# Patient Record
Sex: Female | Born: 1945 | Race: White | Hispanic: No | Marital: Married | Smoking: Never smoker
Health system: Southern US, Community
[De-identification: ages and names within clinical notes are randomized; demographics above are authoritative.]

## PROBLEM LIST (undated history)

## (undated) DIAGNOSIS — E7800 Pure hypercholesterolemia, unspecified: Secondary | ICD-10-CM

## (undated) HISTORY — PX: TUBAL LIGATION: SHX77

## (undated) HISTORY — PX: APPENDECTOMY: SHX54

---

## 2017-02-25 ENCOUNTER — Other Ambulatory Visit: Payer: Self-pay | Admitting: Family Medicine

## 2017-02-25 DIAGNOSIS — N6489 Other specified disorders of breast: Secondary | ICD-10-CM

## 2017-02-28 ENCOUNTER — Ambulatory Visit
Admission: RE | Admit: 2017-02-28 | Discharge: 2017-02-28 | Disposition: A | Discharge status: Home / Self Care | Payer: BLUE CROSS/BLUE SHIELD | Source: Ambulatory Visit | Attending: Family Medicine | Admitting: Family Medicine

## 2017-02-28 ENCOUNTER — Other Ambulatory Visit: Payer: Self-pay | Admitting: Family Medicine

## 2017-02-28 DIAGNOSIS — N6489 Other specified disorders of breast: Secondary | ICD-10-CM

## 2017-03-31 ENCOUNTER — Ambulatory Visit: Payer: Self-pay | Admitting: Surgery

## 2017-03-31 DIAGNOSIS — N6320 Unspecified lump in the left breast, unspecified quadrant: Secondary | ICD-10-CM

## 2017-03-31 DIAGNOSIS — N6310 Unspecified lump in the right breast, unspecified quadrant: Secondary | ICD-10-CM

## 2017-03-31 NOTE — H&P (Signed)
History of Present Illness (Cambrie Sonnenfeld K. Norvell Ureste MD; 03/31/2017 10:03 PM) The patient is a 70 year old female who presents with a breast mass. Referred by Dr. Dan Boyle for left breast CSL and discordant biopsy right breast PCP - Dr. Michael Kalish  This is a 70 yo female in good health who presents after recent routine screening mammogram performed at [facility] in [place]. The original images including ultrasound or any radiology reports are not available to me. On the right, she had a focal area of breast distortion. On the left, she had a focal area of distortion, as well. She underwent bilateral stereotactic biopsies. The right side showed benign findings that were felt to be discordant. The left side showed a complex sclerosing lesion. She is referred for bilateral lumpectomies.  Menarche - 15 First pregnancy - 27 Breastfeed - yes Hormones - no FH - no  CLINICAL DATA: Patient presents for stereotactic core needle biopsy of focal distortion over the upper central right breast.  EXAM: Right BREAST STEREOTACTIC CORE NEEDLE BIOPSY  COMPARISON: Previous exams.  FINDINGS: The patient and I discussed the procedure of stereotactic-guided biopsy including benefits and alternatives. We discussed the high likelihood of a successful procedure. We discussed the risks of the procedure including infection, bleeding, tissue injury, clip migration, and inadequate sampling. Informed written consent was given. The usual time out protocol was performed immediately prior to the procedure.  Using sterile technique and 1% Lidocaine as local anesthetic, under stereotactic guidance, a 9 gauge vacuum assisted device was used to perform core needle biopsy of focal distortion over the upper central right breast using a superior to inferior approach.  Lesion quadrant: Right upper outer quadrant.  At the conclusion of the procedure, a coil shaped tissue marker clip was deployed into the biopsy  cavity. Follow-up 2-view mammogram was performed and dictated separately.  IMPRESSION: Stereotactic-guided biopsy of focal right breast distortion. No apparent complications.  Electronically Signed: By: Daniel Boyle M.D. On: 02/28/2017 09:51  CLINICAL DATA: Patient presents for stereotactic core needle biopsy of focal distortion over the medial central left breast.  EXAM: Left BREAST STEREOTACTIC CORE NEEDLE BIOPSY  COMPARISON: Previous exams.  FINDINGS: The patient and I discussed the procedure of stereotactic-guided biopsy including benefits and alternatives. We discussed the high likelihood of a successful procedure. We discussed the risks of the procedure including infection, bleeding, tissue injury, clip migration, and inadequate sampling. Informed written consent was given. The usual time out protocol was performed immediately prior to the procedure.  Using sterile technique and 1% Lidocaine as local anesthetic, under stereotactic guidance, a 9 gauge vacuum assisted device was used to perform core needle biopsy of the targeted focal distortion over the medial central breast using a superior to inferior approach.  Lesion quadrant: Left upper inner quadrant.  At the conclusion of the procedure, a coil shaped tissue marker clip was deployed into the biopsy cavity. Follow-up 2-view mammogram was performed and dictated separately.  IMPRESSION: Stereotactic-guided biopsy of focal distortion left breast. No apparent complications.  Electronically Signed: By: Daniel Boyle M.D. On: 02/28/2017 09:49  CLINICAL DATA: Patient is post stereotactic core needle biopsy of focal distortion over the upper central right breast and medial central left breast.  EXAM: DIAGNOSTIC bilateral MAMMOGRAM POST stereotactic BIOPSY  COMPARISON: Previous exam(s).  FINDINGS: Mammographic images were obtained following stereotactic guided biopsy of focal distortion over the upper  central right breast and medial central left breast. Images demonstrates satisfactory placement of a coil shaped metallic clip over   the upper central right breast and medial mid to upper left breast in the expected region of the targeted distortion. There is a 2 cm hematoma over the right breast biopsy site.  There is satisfactory placement of a coil shaped metallic clip over the medial mid to upper left breast. Note that the left-sided clip may be approximate 1 cm inferior to the distortion on the MLO image.  IMPRESSION: Satisfactory placement of a coil shaped metallic clips bilaterally after bilateral stereotactic core needle biopsy of bilateral focal distortion.  Final Assessment: Post Procedure Mammograms for Marker Placement   Electronically Signed By: Elberta Fortisaniel Boyle M.D. On: 02/28/2017 10:29   Diagnosis 1. Breast, right, needle core biopsy, upper central - FIBROCYSTIC CHANGES. - USUAL DUCTAL HYPERPLASIA. - THERE IS NO EVIDENCE OF MALIGNANCY. - SEE COMMENT. 2. Breast, left, needle core biopsy, medial central - COMPLEX SCLEROSING LESION. - USUAL DUCTAL HYPERPLASIA. - SEE COMMENT. Microscopic Comment 1. The results were [facility] to [facility] on 03/03/17. (JBK:gt, 03/03/17) Pecola LeisureJOSHUA KISH MD Pathologist, Electronic Signature (Case signed 03/03/2017)   Past Surgical History ([name], RMA; 03/31/2017 9:22 AM) Appendectomy Breast Biopsy Bilateral. Colon Polyp Removal - Colonoscopy  Diagnostic Studies History ([name], RMA; 03/31/2017 9:22 AM) Colonoscopy 1-5 years ago Mammogram within last year Pap Smear 1-5 years ago  Allergies ([name], RMA; 03/31/2017 9:24 AM) No Known Drug Allergies 03/31/2017 Allergies Reconciled  Medication History ([name], RMA; 03/31/2017 9:25 AM) Fluticasone Propionate (50MCG/ACT Suspension, Nasal) Active. Simvastatin (20MG  Tablet, Oral) Active. Vitamin D (2000UNIT Capsule,  Oral) Active. Medications Reconciled  Social History ([name], RMA; 03/31/2017 9:22 AM) Caffeine use Carbonated beverages, Coffee, Tea. No alcohol use Tobacco use Never smoker.  Family History ([name], RMA; 03/31/2017 9:22 AM) Alcohol Abuse Mother. Diabetes Mellitus Mother. Hypertension Mother.  Pregnancy / Birth History ([name], RMA; 03/31/2017 9:22 AM) Age at menarche 15 years. Age of menopause 3851-55 Gravida 3 Irregular periods Length (months) of breastfeeding 3-6 Maternal age 921-25 Para 3  Other Problems ([name], RMA; 03/31/2017 9:22 AM) No pertinent past medical history     Review of Systems (Tanisha A. Brown RMA; 03/31/2017 9:22 AM) General Not Present- Appetite Loss, Chills, Fatigue, Fever, Night Sweats, Weight Gain and Weight Loss. Skin Not Present- Change in Wart/Mole, Dryness, Hives, Jaundice, New Lesions, Non-Healing Wounds, Rash and Ulcer. HEENT Present- Seasonal Allergies and Wears glasses/contact lenses. Not Present- Earache, Hearing Loss, Hoarseness, Nose Bleed, Oral Ulcers, Ringing in the Ears, Sinus Pain, Sore Throat, Visual Disturbances and Yellow Eyes. Respiratory Present- Snoring. Not Present- Bloody sputum, Chronic Cough, Difficulty Breathing and Wheezing. Breast Not Present- Breast Mass, Breast Pain, Nipple Discharge and Skin Changes. Cardiovascular Not Present- Chest Pain, Difficulty Breathing Lying Down, Leg Cramps, Palpitations, Rapid Heart Rate, Shortness of Breath and Swelling of Extremities. Gastrointestinal Not Present- Abdominal Pain, Bloating, Bloody Stool, Change in Bowel Habits, Chronic diarrhea, Constipation, Difficulty Swallowing, Excessive gas, Gets full quickly at meals, Hemorrhoids, Indigestion, Nausea, Rectal Pain and Vomiting. Female Genitourinary Present- Frequency. Not Present- Nocturia, Painful Urination, Pelvic Pain and Urgency. Musculoskeletal Present- Joint Stiffness. Not Present- Back  Pain, Joint Pain, Muscle Pain, Muscle Weakness and Swelling of Extremities. Neurological Not Present- Decreased Memory, Fainting, Headaches, Numbness, Seizures, Tingling, Tremor, Trouble walking and Weakness. Psychiatric Not Present- Anxiety, Bipolar, Change in Sleep Pattern, Depression, Fearful and Frequent crying. Endocrine Not Present- Cold Intolerance, Excessive Hunger, Hair Changes, Heat Intolerance, Hot flashes and New Diabetes. Hematology Not Present- Blood  Thinners, Easy Bruising, Excessive bleeding, Gland problems, HIV and Persistent Infections.  Vitals (Tanisha A. Brown RMA; 03/31/2017 9:24 AM) 03/31/2017 9:23 AM Weight: 159.6 lb Height: 64in Body Surface Area: 1.78 m Body Mass Index: 27.39 kg/m  Temp.: 96.30F  Pulse: 104 (Regular)  P.OX: 93% (Room air) BP: 120/84 (Sitting, Left Arm, Standard)      Physical Exam Molli Hazard K. Ruble Buttler MD; 03/31/2017 10:03 PM)  The physical exam findings are as follows: Note:WDWN in NAD Eyes: Pupils equal, round; sclera anicteric HENT: Oral mucosa moist; good dentition Neck: No masses palpated, no thyromegaly Lungs: CTA bilaterally; normal respiratory effort Breasts: symmetric; healing biopsy sites; no nipple retraction or discharge No dominant masses - bilateral fibrocystic changes; no axillary lymphadenopathy CV: Regular rate and rhythm; no murmurs; extremities well-perfused with no edema Abd: +bowel sounds, soft, non-tender, no palpable organomegaly; no palpable hernias Skin: Warm, dry; no sign of jaundice Psychiatric - alert and oriented x 4; calm mood and affect    Assessment & Plan Molli Hazard K. Willamina Grieshop MD; 03/31/2017 10:06 AM)  MASS OF RIGHT BREAST ON MAMMOGRAM (N63.10)  MASS OF LEFT BREAST ON MAMMOGRAM (N63.20)  Current Plans Schedule for Surgery - Bilateral radioactive seed localized lumpectomies. The surgical procedure has been discussed with the patient. Potential risks, benefits, alternative treatments, and expected  outcomes have been explained. All of the patient's questions at this time have been answered. The likelihood of reaching the patient's treatment goal is good. The patient understand the proposed surgical procedure and wishes to proceed.  Wilmon Arms. Corliss Skains, MD, [facility] Surgery  General/ Trauma Surgery  03/31/2017 10:04 PM

## 2017-04-09 ENCOUNTER — Other Ambulatory Visit: Payer: Self-pay | Admitting: Surgery

## 2017-04-09 DIAGNOSIS — N6320 Unspecified lump in the left breast, unspecified quadrant: Secondary | ICD-10-CM

## 2017-04-10 ENCOUNTER — Encounter (HOSPITAL_BASED_OUTPATIENT_CLINIC_OR_DEPARTMENT_OTHER): Payer: Self-pay | Admitting: *Deleted

## 2017-04-16 ENCOUNTER — Ambulatory Visit
Admission: RE | Admit: 2017-04-16 | Discharge: 2017-04-16 | Disposition: A | Discharge status: Home / Self Care | Payer: Medicare Other | Source: Ambulatory Visit | Attending: Surgery | Admitting: Surgery

## 2017-04-16 DIAGNOSIS — N6320 Unspecified lump in the left breast, unspecified quadrant: Secondary | ICD-10-CM

## 2017-04-16 DIAGNOSIS — N6310 Unspecified lump in the right breast, unspecified quadrant: Secondary | ICD-10-CM

## 2017-04-16 NOTE — Progress Notes (Signed)
Ensure pre surgery drink given with instructions to complete by 0530 dos, pt verbalized understanding. 

## 2017-04-17 ENCOUNTER — Ambulatory Visit (HOSPITAL_BASED_OUTPATIENT_CLINIC_OR_DEPARTMENT_OTHER): Payer: BLUE CROSS/BLUE SHIELD | Admitting: Anesthesiology

## 2017-04-17 ENCOUNTER — Encounter (HOSPITAL_BASED_OUTPATIENT_CLINIC_OR_DEPARTMENT_OTHER)
Admission: RE | Disposition: A | Discharge status: Home / Self Care | Payer: Self-pay | Source: Ambulatory Visit | Attending: Surgery

## 2017-04-17 ENCOUNTER — Encounter (HOSPITAL_BASED_OUTPATIENT_CLINIC_OR_DEPARTMENT_OTHER): Payer: Self-pay | Admitting: Anesthesiology

## 2017-04-17 ENCOUNTER — Ambulatory Visit (HOSPITAL_BASED_OUTPATIENT_CLINIC_OR_DEPARTMENT_OTHER)
Admission: RE | Admit: 2017-04-17 | Discharge: 2017-04-17 | Disposition: A | Discharge status: Home / Self Care | Payer: BLUE CROSS/BLUE SHIELD | Source: Ambulatory Visit | Attending: Surgery | Admitting: Surgery

## 2017-04-17 ENCOUNTER — Ambulatory Visit
Admission: RE | Admit: 2017-04-17 | Discharge: 2017-04-17 | Disposition: A | Discharge status: Home / Self Care | Payer: Medicare Other | Source: Ambulatory Visit | Attending: Surgery | Admitting: Surgery

## 2017-04-17 DIAGNOSIS — N6022 Fibroadenosis of left breast: Secondary | ICD-10-CM | POA: Diagnosis not present

## 2017-04-17 DIAGNOSIS — N6092 Unspecified benign mammary dysplasia of left breast: Secondary | ICD-10-CM | POA: Insufficient documentation

## 2017-04-17 DIAGNOSIS — N6091 Unspecified benign mammary dysplasia of right breast: Secondary | ICD-10-CM | POA: Diagnosis not present

## 2017-04-17 DIAGNOSIS — N6320 Unspecified lump in the left breast, unspecified quadrant: Secondary | ICD-10-CM

## 2017-04-17 DIAGNOSIS — N6011 Diffuse cystic mastopathy of right breast: Secondary | ICD-10-CM | POA: Insufficient documentation

## 2017-04-17 DIAGNOSIS — N6310 Unspecified lump in the right breast, unspecified quadrant: Secondary | ICD-10-CM | POA: Diagnosis present

## 2017-04-17 HISTORY — DX: Pure hypercholesterolemia, unspecified: E78.00

## 2017-04-17 HISTORY — PX: BREAST LUMPECTOMY WITH RADIOACTIVE SEED LOCALIZATION: SHX6424

## 2017-04-17 SURGERY — BREAST LUMPECTOMY WITH RADIOACTIVE SEED LOCALIZATION
Anesthesia: General | Site: Breast | Laterality: Bilateral

## 2017-04-17 MED ORDER — LIDOCAINE 2% (20 MG/ML) 5 ML SYRINGE
INTRAMUSCULAR | Status: AC
  Filled 2017-04-17: qty 5

## 2017-04-17 MED ORDER — GABAPENTIN 300 MG PO CAPS
ORAL_CAPSULE | ORAL | Status: AC
  Filled 2017-04-17: qty 1

## 2017-04-17 MED ORDER — FENTANYL CITRATE (PF) 100 MCG/2ML IJ SOLN: 25.0000 ug | INTRAMUSCULAR | Status: DC | PRN

## 2017-04-17 MED ORDER — PROPOFOL 10 MG/ML IV BOLUS
INTRAVENOUS | Status: DC | PRN
  Administered 2017-04-17: 100 mg via INTRAVENOUS

## 2017-04-17 MED ORDER — CEFAZOLIN SODIUM-DEXTROSE 2-4 GM/100ML-% IV SOLN
INTRAVENOUS | Status: AC
  Filled 2017-04-17: qty 100

## 2017-04-17 MED ORDER — ACETAMINOPHEN 160 MG/5ML PO SOLN: 325.0000 mg | ORAL | Status: DC | PRN

## 2017-04-17 MED ORDER — CEFAZOLIN SODIUM-DEXTROSE 2-4 GM/100ML-% IV SOLN
2.0000 g | INTRAVENOUS | Status: AC
  Administered 2017-04-17: 2 g via INTRAVENOUS

## 2017-04-17 MED ORDER — CHLORHEXIDINE GLUCONATE CLOTH 2 % EX PADS: 6.0000 | MEDICATED_PAD | Freq: Once | CUTANEOUS | Status: DC

## 2017-04-17 MED ORDER — LACTATED RINGERS IV SOLN
INTRAVENOUS | Status: DC
  Administered 2017-04-17 (×3): via INTRAVENOUS

## 2017-04-17 MED ORDER — MIDAZOLAM HCL 2 MG/2ML IJ SOLN
INTRAMUSCULAR | Status: AC
  Filled 2017-04-17: qty 2

## 2017-04-17 MED ORDER — ONDANSETRON HCL 4 MG/2ML IJ SOLN
INTRAMUSCULAR | Status: AC
  Filled 2017-04-17: qty 2

## 2017-04-17 MED ORDER — FENTANYL CITRATE (PF) 100 MCG/2ML IJ SOLN
INTRAMUSCULAR | Status: AC
  Filled 2017-04-17: qty 2

## 2017-04-17 MED ORDER — PROPOFOL 10 MG/ML IV BOLUS
INTRAVENOUS | Status: AC
  Filled 2017-04-17: qty 20

## 2017-04-17 MED ORDER — SCOPOLAMINE 1 MG/3DAYS TD PT72: 1.0000 | MEDICATED_PATCH | Freq: Once | TRANSDERMAL | Status: DC | PRN

## 2017-04-17 MED ORDER — BUPIVACAINE-EPINEPHRINE (PF) 0.5% -1:200000 IJ SOLN
INTRAMUSCULAR | Status: AC
  Filled 2017-04-17: qty 30

## 2017-04-17 MED ORDER — SCOPOLAMINE 1 MG/3DAYS TD PT72
MEDICATED_PATCH | TRANSDERMAL | Status: AC
  Filled 2017-04-17: qty 1

## 2017-04-17 MED ORDER — BUPIVACAINE-EPINEPHRINE 0.5% -1:200000 IJ SOLN
INTRAMUSCULAR | Status: DC | PRN
  Administered 2017-04-17: 20 mL

## 2017-04-17 MED ORDER — GLYCOPYRROLATE 0.2 MG/ML IJ SOLN
INTRAMUSCULAR | Status: DC | PRN
  Administered 2017-04-17: 0.1 mg via INTRAVENOUS

## 2017-04-17 MED ORDER — MIDAZOLAM HCL 5 MG/5ML IJ SOLN
INTRAMUSCULAR | Status: DC | PRN
  Administered 2017-04-17: 2 mg via INTRAVENOUS

## 2017-04-17 MED ORDER — EPHEDRINE 5 MG/ML INJ
INTRAVENOUS | Status: AC
  Filled 2017-04-17: qty 10

## 2017-04-17 MED ORDER — EPHEDRINE SULFATE 50 MG/ML IJ SOLN
INTRAMUSCULAR | Status: DC | PRN
  Administered 2017-04-17: 10 mg via INTRAVENOUS

## 2017-04-17 MED ORDER — DEXAMETHASONE SODIUM PHOSPHATE 10 MG/ML IJ SOLN
INTRAMUSCULAR | Status: AC
  Filled 2017-04-17: qty 1

## 2017-04-17 MED ORDER — ACETAMINOPHEN 325 MG PO TABS: 325.0000 mg | ORAL_TABLET | ORAL | Status: DC | PRN

## 2017-04-17 MED ORDER — LIDOCAINE HCL (CARDIAC) 20 MG/ML IV SOLN
INTRAVENOUS | Status: DC | PRN
  Administered 2017-04-17: 30 mg via INTRAVENOUS

## 2017-04-17 MED ORDER — ACETAMINOPHEN 500 MG PO TABS
ORAL_TABLET | ORAL | Status: AC
Start: 2017-04-17 — End: 2017-04-17
  Filled 2017-04-17: qty 2

## 2017-04-17 MED ORDER — OXYCODONE HCL 5 MG/5ML PO SOLN: 5.0000 mg | Freq: Once | ORAL | Status: DC | PRN

## 2017-04-17 MED ORDER — FENTANYL CITRATE (PF) 100 MCG/2ML IJ SOLN: 50.0000 ug | INTRAMUSCULAR | Status: DC | PRN

## 2017-04-17 MED ORDER — HYDROCODONE-ACETAMINOPHEN 5-325 MG PO TABS: 1.0000 | ORAL_TABLET | Freq: Four times a day (QID) | ORAL | 0 refills | Status: AC | PRN

## 2017-04-17 MED ORDER — FENTANYL CITRATE (PF) 100 MCG/2ML IJ SOLN
INTRAMUSCULAR | Status: DC | PRN
  Administered 2017-04-17 (×2): 25 ug via INTRAVENOUS
  Administered 2017-04-17: 100 ug via INTRAVENOUS
  Administered 2017-04-17: 25 ug via INTRAVENOUS

## 2017-04-17 MED ORDER — DEXAMETHASONE SODIUM PHOSPHATE 4 MG/ML IJ SOLN
INTRAMUSCULAR | Status: DC | PRN
  Administered 2017-04-17: 10 mg via INTRAVENOUS

## 2017-04-17 MED ORDER — ONDANSETRON HCL 4 MG/2ML IJ SOLN
INTRAMUSCULAR | Status: DC | PRN
  Administered 2017-04-17: 4 mg via INTRAVENOUS

## 2017-04-17 MED ORDER — ACETAMINOPHEN 500 MG PO TABS
1000.0000 mg | ORAL_TABLET | ORAL | Status: AC
  Administered 2017-04-17: 1000 mg via ORAL

## 2017-04-17 MED ORDER — OXYCODONE HCL 5 MG PO TABS: 5.0000 mg | ORAL_TABLET | Freq: Once | ORAL | Status: DC | PRN

## 2017-04-17 MED ORDER — PHENYLEPHRINE 40 MCG/ML (10ML) SYRINGE FOR IV PUSH (FOR BLOOD PRESSURE SUPPORT)
PREFILLED_SYRINGE | INTRAVENOUS | Status: AC
  Filled 2017-04-17: qty 10

## 2017-04-17 MED ORDER — MIDAZOLAM HCL 2 MG/2ML IJ SOLN: 1.0000 mg | INTRAMUSCULAR | Status: DC | PRN

## 2017-04-17 MED ORDER — PHENYLEPHRINE HCL 10 MG/ML IJ SOLN
INTRAMUSCULAR | Status: DC | PRN
  Administered 2017-04-17: 80 ug via INTRAVENOUS

## 2017-04-17 MED ORDER — GABAPENTIN 300 MG PO CAPS
300.0000 mg | ORAL_CAPSULE | ORAL | Status: AC
  Administered 2017-04-17: 300 mg via ORAL

## 2017-04-17 SURGICAL SUPPLY — 46 items
APPLIER CLIP 9.375 MED OPEN (MISCELLANEOUS) ×3
BENZOIN TINCTURE PRP APPL 2/3 (GAUZE/BANDAGES/DRESSINGS) ×6 IMPLANT
BLADE HEX COATED 2.75 (ELECTRODE) ×3 IMPLANT
BLADE SURG 15 STRL LF DISP TIS (BLADE) ×2 IMPLANT
BLADE SURG 15 STRL SS (BLADE) ×4
CANISTER SUCT 1200ML W/VALVE (MISCELLANEOUS) ×3 IMPLANT
CHLORAPREP W/TINT 26ML (MISCELLANEOUS) ×3 IMPLANT
CLIP APPLIE 9.375 MED OPEN (MISCELLANEOUS) ×1 IMPLANT
CLOSURE WOUND 1/2 X4 (GAUZE/BANDAGES/DRESSINGS) ×1
COVER BACK TABLE 60X90IN (DRAPES) ×3 IMPLANT
COVER MAYO STAND STRL (DRAPES) ×3 IMPLANT
COVER PROBE W GEL 5X96 (DRAPES) ×3 IMPLANT
DECANTER SPIKE VIAL GLASS SM (MISCELLANEOUS) ×3 IMPLANT
DEVICE DUBIN W/COMP PLATE 8390 (MISCELLANEOUS) ×3 IMPLANT
DRAPE LAPAROSCOPIC ABDOMINAL (DRAPES) ×3 IMPLANT
DRAPE UTILITY XL STRL (DRAPES) ×3 IMPLANT
DRSG TEGADERM 4X4.75 (GAUZE/BANDAGES/DRESSINGS) ×6 IMPLANT
ELECT BLADE 4.0 EZ CLEAN MEGAD (MISCELLANEOUS) ×3
ELECT REM PT RETURN 9FT ADLT (ELECTROSURGICAL) ×3
ELECTRODE BLDE 4.0 EZ CLN MEGD (MISCELLANEOUS) ×1 IMPLANT
ELECTRODE REM PT RTRN 9FT ADLT (ELECTROSURGICAL) ×1 IMPLANT
GLOVE BIO SURGEON STRL SZ7 (GLOVE) ×6 IMPLANT
GLOVE BIOGEL PI IND STRL 7.5 (GLOVE) ×3 IMPLANT
GLOVE BIOGEL PI INDICATOR 7.5 (GLOVE) ×6
GOWN STRL REUS W/ TWL LRG LVL3 (GOWN DISPOSABLE) ×2 IMPLANT
GOWN STRL REUS W/TWL LRG LVL3 (GOWN DISPOSABLE) ×4
ILLUMINATOR WAVEGUIDE N/F (MISCELLANEOUS) ×3 IMPLANT
KIT MARKER MARGIN INK (KITS) ×3 IMPLANT
NEEDLE HYPO 25X1 1.5 SAFETY (NEEDLE) ×3 IMPLANT
NS IRRIG 1000ML POUR BTL (IV SOLUTION) ×3 IMPLANT
PACK BASIN DAY SURGERY FS (CUSTOM PROCEDURE TRAY) ×3 IMPLANT
PENCIL BUTTON HOLSTER BLD 10FT (ELECTRODE) ×3 IMPLANT
SLEEVE SCD COMPRESS KNEE MED (MISCELLANEOUS) ×3 IMPLANT
SPONGE GAUZE 2X2 8PLY STER LF (GAUZE/BANDAGES/DRESSINGS) ×2
SPONGE GAUZE 2X2 8PLY STRL LF (GAUZE/BANDAGES/DRESSINGS) ×4 IMPLANT
SPONGE LAP 4X18 X RAY DECT (DISPOSABLE) ×6 IMPLANT
STRIP CLOSURE SKIN 1/2X4 (GAUZE/BANDAGES/DRESSINGS) ×2 IMPLANT
SUT MON AB 4-0 PC3 18 (SUTURE) ×6 IMPLANT
SUT VIC AB 3-0 SH 27 (SUTURE) ×4
SUT VIC AB 3-0 SH 27X BRD (SUTURE) ×2 IMPLANT
SYR CONTROL 10ML LL (SYRINGE) ×3 IMPLANT
TOWEL OR 17X24 6PK STRL BLUE (TOWEL DISPOSABLE) ×3 IMPLANT
TOWEL OR NON WOVEN STRL DISP B (DISPOSABLE) ×3 IMPLANT
TUBE CONNECTING 20'X1/4 (TUBING) ×1
TUBE CONNECTING 20X1/4 (TUBING) ×2 IMPLANT
YANKAUER SUCT BULB TIP NO VENT (SUCTIONS) ×3 IMPLANT

## 2017-04-17 NOTE — Interval H&P Note (Signed)
History and Physical Interval Note:  04/17/2017 8:42 AM  Stacey Harvey  has presented today for surgery, with the diagnosis of BILATERAL BREAST MASSES  The various methods of treatment have been discussed with the patient and family. After consideration of risks, benefits and other options for treatment, the patient has consented to  Procedure(s): BILATERAL BREAST LUMPECTOMY WITH RADIOACTIVE SEED LOCALIZATION (Bilateral) as a surgical intervention .  The patient's history has been reviewed, patient examined, no change in status, stable for surgery.  I have reviewed the patient's chart and labs.  Questions were answered to the patient's satisfaction.     Lanissa Cashen K.

## 2017-04-17 NOTE — H&P (View-Only) (Signed)
History of Present Illness Stacey Harvey. Whittaker Lenis MD; 03/31/2017 10:03 PM) The patient is a 71 year old female who presents with a breast mass. Referred by Dr. Lindi Adie for left breast CSL and discordant biopsy right breast PCP - Dr. Loyal Jacobson  This is a 71 yo female in good health who presents after recent routine screening mammogram performed at [facility] in [facility]. The original images including ultrasound or any radiology reports are not available to me. On the right, she had a focal area of breast distortion. On the left, she had a focal area of distortion, as well. She underwent bilateral stereotactic biopsies. The right side showed benign findings that were felt to be discordant. The left side showed a complex sclerosing lesion. She is referred for bilateral lumpectomies.  Menarche - 12 First pregnancy - 27 Breastfeed - yes Hormones - no FH - no  CLINICAL DATA: Patient presents for stereotactic core needle biopsy of focal distortion over the upper central right breast.  EXAM: Right BREAST STEREOTACTIC CORE NEEDLE BIOPSY  COMPARISON: Previous exams.  FINDINGS: The patient and I discussed the procedure of stereotactic-guided biopsy including benefits and alternatives. We discussed the high likelihood of a successful procedure. We discussed the risks of the procedure including infection, bleeding, tissue injury, clip migration, and inadequate sampling. Informed written consent was given. The usual time out protocol was performed immediately prior to the procedure.  Using sterile technique and 1% Lidocaine as local anesthetic, under stereotactic guidance, a 9 gauge vacuum assisted device was used to perform core needle biopsy of focal distortion over the upper central right breast using a superior to inferior approach.  Lesion quadrant: Right upper outer quadrant.  At the conclusion of the procedure, a coil shaped tissue marker clip was deployed into the biopsy  cavity. Follow-up 2-view mammogram was performed and dictated separately.  IMPRESSION: Stereotactic-guided biopsy of focal right breast distortion. No apparent complications.  Electronically Signed: By: Elberta Fortis M.D. On: 02/28/2017 09:51  CLINICAL DATA: Patient presents for stereotactic core needle biopsy of focal distortion over the medial central left breast.  EXAM: Left BREAST STEREOTACTIC CORE NEEDLE BIOPSY  COMPARISON: Previous exams.  FINDINGS: The patient and I discussed the procedure of stereotactic-guided biopsy including benefits and alternatives. We discussed the high likelihood of a successful procedure. We discussed the risks of the procedure including infection, bleeding, tissue injury, clip migration, and inadequate sampling. Informed written consent was given. The usual time out protocol was performed immediately prior to the procedure.  Using sterile technique and 1% Lidocaine as local anesthetic, under stereotactic guidance, a 9 gauge vacuum assisted device was used to perform core needle biopsy of the targeted focal distortion over the medial central breast using a superior to inferior approach.  Lesion quadrant: Left upper inner quadrant.  At the conclusion of the procedure, a coil shaped tissue marker clip was deployed into the biopsy cavity. Follow-up 2-view mammogram was performed and dictated separately.  IMPRESSION: Stereotactic-guided biopsy of focal distortion left breast. No apparent complications.  Electronically Signed: By: Elberta Fortis M.D. On: 02/28/2017 09:49  CLINICAL DATA: Patient is post stereotactic core needle biopsy of focal distortion over the upper central right breast and medial central left breast.  EXAM: DIAGNOSTIC bilateral MAMMOGRAM POST stereotactic BIOPSY  COMPARISON: Previous exam(s).  FINDINGS: Mammographic images were obtained following stereotactic guided biopsy of focal distortion over the upper  central right breast and medial central left breast. Images demonstrates satisfactory placement of a coil shaped metallic clip over  the upper central right breast and medial mid to upper left breast in the expected region of the targeted distortion. There is a 2 cm hematoma over the right breast biopsy site.  There is satisfactory placement of a coil shaped metallic clip over the medial mid to upper left breast. Note that the left-sided clip may be approximate 1 cm inferior to the distortion on the MLO image.  IMPRESSION: Satisfactory placement of a coil shaped metallic clips bilaterally after bilateral stereotactic core needle biopsy of bilateral focal distortion.  Final Assessment: Post Procedure Mammograms for Marker Placement   Electronically Signed By: Elberta Fortisaniel Boyle M.D. On: 02/28/2017 10:29   Diagnosis 1. Breast, right, needle core biopsy, upper central - FIBROCYSTIC CHANGES. - USUAL DUCTAL HYPERPLASIA. - THERE IS NO EVIDENCE OF MALIGNANCY. - SEE COMMENT. 2. Breast, left, needle core biopsy, medial central - COMPLEX SCLEROSING LESION. - USUAL DUCTAL HYPERPLASIA. - SEE COMMENT. Microscopic Comment 1. The results were called to [facility] on 03/03/17. (JBK:gt, 03/03/17) Pecola LeisureJOSHUA KISH MD Pathologist, Electronic Signature (Case signed 03/03/2017)   Past Surgical History (Stacey Harvey, RMA; 03/31/2017 9:22 AM) Appendectomy Breast Biopsy Bilateral. Colon Polyp Removal - Colonoscopy  Diagnostic Studies History (Stacey Harvey, RMA; 03/31/2017 9:22 AM) Colonoscopy 1-5 years ago Mammogram within last year Pap Smear 1-5 years ago  Allergies (Stacey Harvey, RMA; 03/31/2017 9:24 AM) No Known Drug Allergies 03/31/2017 Allergies Reconciled  Medication History (Stacey Harvey, RMA; 03/31/2017 9:25 AM) Fluticasone Propionate (50MCG/ACT Suspension, Nasal) Active. Simvastatin (20MG  Tablet, Oral) Active. Vitamin D (2000UNIT Capsule,  Oral) Active. Medications Reconciled  Social History (Stacey Harvey, RMA; 03/31/2017 9:22 AM) Caffeine use Carbonated beverages, Coffee, Tea. No alcohol use Tobacco use Never smoker.  Family History (Stacey Harvey, RMA; 03/31/2017 9:22 AM) Alcohol Abuse Mother. Diabetes Mellitus Mother. Hypertension Mother.  Pregnancy / Birth History (Stacey Harvey, RMA; 03/31/2017 9:22 AM) Age at menarche 15 years. Age of menopause 3851-55 Gravida 3 Irregular periods Length (months) of breastfeeding 3-6 Maternal age 921-25 Para 3  Other Problems (Stacey Harvey, RMA; 03/31/2017 9:22 AM) No pertinent past medical history     Review of Systems (Stacey A. Brown RMA; 03/31/2017 9:22 AM) General Not Present- Appetite Loss, Chills, Fatigue, Fever, Night Sweats, Weight Gain and Weight Loss. Skin Not Present- Change in Wart/Mole, Dryness, Hives, Jaundice, New Lesions, Non-Healing Wounds, Rash and Ulcer. HEENT Present- Seasonal Allergies and Wears glasses/contact lenses. Not Present- Earache, Hearing Loss, Hoarseness, Nose Bleed, Oral Ulcers, Ringing in the Ears, Sinus Pain, Sore Throat, Visual Disturbances and Yellow Eyes. Respiratory Present- Snoring. Not Present- Bloody sputum, Chronic Cough, Difficulty Breathing and Wheezing. Breast Not Present- Breast Mass, Breast Pain, Nipple Discharge and Skin Changes. Cardiovascular Not Present- Chest Pain, Difficulty Breathing Lying Down, Leg Cramps, Palpitations, Rapid Heart Rate, Shortness of Breath and Swelling of Extremities. Gastrointestinal Not Present- Abdominal Pain, Bloating, Bloody Stool, Change in Bowel Habits, Chronic diarrhea, Constipation, Difficulty Swallowing, Excessive gas, Gets full quickly at meals, Hemorrhoids, Indigestion, Nausea, Rectal Pain and Vomiting. Female Genitourinary Present- Frequency. Not Present- Nocturia, Painful Urination, Pelvic Pain and Urgency. Musculoskeletal Present- Joint Stiffness. Not Present- Back  Pain, Joint Pain, Muscle Pain, Muscle Weakness and Swelling of Extremities. Neurological Not Present- Decreased Memory, Fainting, Headaches, Numbness, Seizures, Tingling, Tremor, Trouble walking and Weakness. Psychiatric Not Present- Anxiety, Bipolar, Change in Sleep Pattern, Depression, Fearful and Frequent crying. Endocrine Not Present- Cold Intolerance, Excessive Hunger, Hair Changes, Heat Intolerance, Hot flashes and New Diabetes. Hematology Not Present- Blood  Thinners, Easy Bruising, Excessive bleeding, Gland problems, HIV and Persistent Infections.  Vitals (Stacey A. Brown RMA; 03/31/2017 9:24 AM) 03/31/2017 9:23 AM Weight: 159.6 lb Height: 64in Body Surface Area: 1.78 m Body Mass Index: 27.39 kg/m  Temp.: 96.30F  Pulse: 104 (Regular)  P.OX: 93% (Room air) BP: 120/84 (Sitting, Left Arm, Standard)      Physical Exam Molli Hazard K. Monica Zahler MD; 03/31/2017 10:03 PM)  The physical exam findings are as follows: Note:WDWN in NAD Eyes: Pupils equal, round; sclera anicteric HENT: Oral mucosa moist; good dentition Neck: No masses palpated, no thyromegaly Lungs: CTA bilaterally; normal respiratory effort Breasts: symmetric; healing biopsy sites; no nipple retraction or discharge No dominant masses - bilateral fibrocystic changes; no axillary lymphadenopathy CV: Regular rate and rhythm; no murmurs; extremities well-perfused with no edema Abd: +bowel sounds, soft, non-tender, no palpable organomegaly; no palpable hernias Skin: Warm, dry; no sign of jaundice Psychiatric - alert and oriented x 4; calm mood and affect    Assessment & Plan Molli Hazard K. Tashianna Broome MD; 03/31/2017 10:06 AM)  MASS OF RIGHT BREAST ON MAMMOGRAM (N63.10)  MASS OF LEFT BREAST ON MAMMOGRAM (N63.20)  Current Plans Schedule for Surgery - Bilateral radioactive seed localized lumpectomies. The surgical procedure has been discussed with the patient. Potential risks, benefits, alternative treatments, and expected  outcomes have been explained. All of the patient's questions at this time have been answered. The likelihood of reaching the patient's treatment goal is good. The patient understand the proposed surgical procedure and wishes to proceed.  Stacey Harvey. Corliss Skains, MD, [facility] Surgery  General/ Trauma Surgery  03/31/2017 10:04 PM

## 2017-04-17 NOTE — Anesthesia Postprocedure Evaluation (Signed)
Anesthesia Post Note  Patient: Lanaya Rasnick  Procedure(s) Performed: Procedure(s) (LRB): BILATERAL BREAST LUMPECTOMY WITH RADIOACTIVE SEED LOCALIZATION (Bilateral)     Patient location during evaluation: PACU Anesthesia Type: General Level of consciousness: awake and alert Pain management: pain level controlled Vital Signs Assessment: post-procedure vital signs reviewed and stable Respiratory status: spontaneous breathing, nonlabored ventilation and respiratory function stable Cardiovascular status: blood pressure returned to baseline and stable Postop Assessment: no signs of nausea or vomiting Anesthetic complications: no    Last Vitals:  Vitals:   04/17/17 1100 04/17/17 1115  BP: (!) 147/72 (!) 145/79  Pulse: 79 84  Resp: 12 (!) 21  Temp:    SpO2: 99% 97%    Last Pain:  Vitals:   04/17/17 1100  TempSrc:   PainSc: 0-No pain                 Lowella CurbWarren Wander Verania Salberg

## 2017-04-17 NOTE — Anesthesia Preprocedure Evaluation (Signed)
Anesthesia Evaluation  Patient identified by MRN, date of birth, ID band Patient awake    Reviewed: Allergy & Precautions, NPO status , Patient's Chart, lab work & pertinent test results  History of Anesthesia Complications Negative for: history of anesthetic complications  Airway Mallampati: III  TM Distance: >3 FB Neck ROM: Full    Dental  (+) Teeth Intact   Pulmonary neg pulmonary ROS,    breath sounds clear to auscultation       Cardiovascular negative cardio ROS   Rhythm:Regular     Neuro/Psych negative neurological ROS  negative psych ROS   GI/Hepatic negative GI ROS, Neg liver ROS,   Endo/Other  negative endocrine ROS  Renal/GU negative Renal ROS     Musculoskeletal   Abdominal   Peds  Hematology negative hematology ROS (+)   Anesthesia Other Findings   Reproductive/Obstetrics                             Anesthesia Physical Anesthesia Plan  ASA: I  Anesthesia Plan: General   Post-op Pain Management:    Induction: Intravenous  PONV Risk Score and Plan: 3 and Ondansetron, Dexamethasone and Treatment may vary due to age or medical condition  Airway Management Planned: LMA  Additional Equipment: None  Intra-op Plan:   Post-operative Plan: Extubation in OR  Informed Consent: I have reviewed the patients History and Physical, chart, labs and discussed the procedure including the risks, benefits and alternatives for the proposed anesthesia with the patient or authorized representative who has indicated his/her understanding and acceptance.   Dental advisory given  Plan Discussed with: CRNA and Surgeon  Anesthesia Plan Comments:         Anesthesia Quick Evaluation

## 2017-04-17 NOTE — Discharge Instructions (Signed)
[facility] °Office Phone Number [phone] ° °BREAST BIOPSY/ PARTIAL MASTECTOMY: POST OP INSTRUCTIONS ° °Always review your discharge instruction sheet given to you by the facility where your surgery was performed. ° °IF YOU HAVE DISABILITY OR FAMILY LEAVE FORMS, YOU MUST BRING THEM TO THE OFFICE FOR PROCESSING.  DO NOT GIVE THEM TO YOUR DOCTOR. ° °1. A prescription for pain medication may be given to you upon discharge.  Take your pain medication as prescribed, if needed.  If narcotic pain medicine is not needed, then you may take acetaminophen (Tylenol) or ibuprofen (Advil) as needed. °2. Take your usually prescribed medications unless otherwise directed °3. If you need a refill on your pain medication, please contact your pharmacy.  They will contact our office to request authorization.  Prescriptions will not be filled after 5pm or on week-ends. °4. You should eat very light the first 24 hours after surgery, such as soup, crackers, pudding, etc.  Resume your normal diet the day after surgery. °5. Most patients will experience some swelling and bruising in the breast.  Ice packs and a good support bra will help.  Swelling and bruising can take several days to resolve.  °6. It is common to experience some constipation if taking pain medication after surgery.  Increasing fluid intake and taking a stool softener will usually help or prevent this problem from occurring.  A mild laxative (Milk of Magnesia or Miralax) should be taken according to package directions if there are no bowel movements after 48 hours. °7. Unless discharge instructions indicate otherwise, you may remove your bandages 24-48 hours after surgery, and you may shower at that time.  You may have steri-strips (small skin tapes) in place directly over the incision.  These strips should be left on the skin for 7-10 days.  If your surgeon used skin glue on the incision, you may shower in 24 hours.  The glue will flake off over the  next 2-3 weeks.  Any sutures or staples will be removed at the office during your follow-up visit. °8. ACTIVITIES:  You may resume regular daily activities (gradually increasing) beginning the next day.  Wearing a good support bra or sports bra minimizes pain and swelling.  You may have sexual intercourse when it is comfortable. °a. You may drive when you no longer are taking prescription pain medication, you can comfortably wear a seatbelt, and you can safely maneuver your car and apply brakes. °b. RETURN TO WORK:  ______________________________________________________________________________________ °9. You should see your doctor in the office for a follow-up appointment approximately two weeks after your surgery.  Your doctor’s nurse will typically make your follow-up appointment when she calls you with your pathology report.  Expect your pathology report 2-3 business days after your surgery.  You may call to check if you do not hear from us after three days. °10. OTHER INSTRUCTIONS: _______________________________________________________________________________________________ _____________________________________________________________________________________________________________________________________ °_____________________________________________________________________________________________________________________________________ °_____________________________________________________________________________________________________________________________________ ° °WHEN TO CALL YOUR DOCTOR: °1. Fever over 101.0 °2. Nausea and/or vomiting. °3. Extreme swelling or bruising. °4. Continued bleeding from incision. °5. Increased pain, redness, or drainage from the incision. ° °The clinic staff is available to answer your questions during regular business hours.  Please don’t hesitate to call and ask to speak to one of the nurses for clinical concerns.  If you have a medical emergency, go to the nearest  emergency room or call 911.  A surgeon from [facility] is always on call at the hospital. ° °For further questions, please visit [email or web address]  ° ° ° ° °  Post Anesthesia Home Care Instructions ° °Activity: °Get plenty of rest for the remainder of the day. A responsible individual must stay with you for 24 hours following the procedure.  °For the next 24 hours, DO NOT: °-Drive a car °-Operate machinery °-Drink alcoholic beverages °-Take any medication unless instructed by your physician °-Make any legal decisions or sign important papers. ° °Meals: °Start with liquid foods such as gelatin or soup. Progress to regular foods as tolerated. Avoid greasy, spicy, heavy foods. If nausea and/or vomiting occur, drink only clear liquids until the nausea and/or vomiting subsides. Call your physician if vomiting continues. ° °Special Instructions/Symptoms: °Your throat may feel dry or sore from the anesthesia or the breathing tube placed in your throat during surgery. If this causes discomfort, gargle with warm salt water. The discomfort should disappear within 24 hours. ° °If you had a scopolamine patch placed behind your ear for the management of post- operative nausea and/or vomiting: ° °1. The medication in the patch is effective for 72 hours, after which it should be removed.  Wrap patch in a tissue and discard in the trash. Wash hands thoroughly with soap and water. °2. You may remove the patch earlier than 72 hours if you experience unpleasant side effects which may include dry mouth, dizziness or visual disturbances. °3. Avoid touching the patch. Wash your hands with soap and water after contact with the patch. °  ° °

## 2017-04-17 NOTE — Anesthesia Procedure Notes (Signed)
Procedure Name: LMA Insertion Date/Time: 04/17/2017 9:03 AM Performed by: Genevieve NorlanderLINKA, Stacey Harvey Pre-anesthesia Checklist: Patient identified, Emergency Drugs available, Suction available, Patient being monitored and Timeout performed Patient Re-evaluated:Patient Re-evaluated prior to induction Oxygen Delivery Method: Circle system utilized Preoxygenation: Pre-oxygenation with 100% oxygen Induction Type: IV induction Ventilation: Mask ventilation without difficulty LMA: LMA inserted LMA Size: 4.0 Number of attempts: 1 Airway Equipment and Method: Bite block Placement Confirmation: positive ETCO2 Tube secured with: Tape Dental Injury: Teeth and Oropharynx as per pre-operative assessment

## 2017-04-17 NOTE — Op Note (Signed)
Pre-op Diagnosis:  Left complex sclerosing lesion, right breast mass Post-op Diagnosis: same Procedure:  Bilateral radioactive seed localized lumpectomy Surgeon:  Petros Ahart K. Anesthesia:  GEN - LMA Indications:  This is a 71 yo female in good health who presents after recent routine screening mammogram performed at [facility] in [facility]. The original images including ultrasound or any radiology reports are not available to me. On the right, she had a focal area of breast distortion. On the left, she had a focal area of distortion, as well. She underwent bilateral stereotactic biopsies. The right side showed benign findings that were felt to be discordant. The left side showed a complex sclerosing lesion. She is referred for bilateral lumpectomies.   Description of procedure: The patient is brought to the operating room placed in supine position on the operating room table. After an adequate level of general anesthesia was obtained, the Tegaderm dressings from the seed placement were removed.  There was some skin blistering and irritation from the dressings.  Her chest was prepped with ChloraPrep and draped in sterile fashion. A timeout was taken to ensure the proper patient and proper procedure.  We began on her right side. We interrogated the breast with the neoprobe. We made a circumareolar incision around the upper lateral side of the nipple after infiltrating with 0.5% Marcaine. Dissection was carried down in the breast tissue with cautery. We used the neoprobe to guide [place] towards the radioactive seed in the upper outer quadrant. We excised an area of tissue around the radioactive seed 2 cm in diameter. The specimen was removed and was oriented with a paint kit. Specimen mammogram showed the radioactive seed as well as the biopsy clip within the specimen. This was sent for pathologic examination. There is no residual radioactivity within the biopsy cavity. We inspected carefully for  hemostasis. The wound was thoroughly irrigated.    We then turned our attention to the left side.  We interrogated the breast with the neoprobe. We made a circumareolar incision around the upper medial side of the nipple after infiltrating with 0.5% Marcaine. Dissection was carried down in the breast tissue with cautery. We used the neoprobe to guide [place] towards the radioactive seed. We excised an area of tissue around the radioactive seed 2 cm in diameter. The specimen was removed and was oriented with a paint kit. Specimen mammogram showed the radioactive seed as well as the biopsy clip within the specimen. This was sent for pathologic examination. There is no residual radioactivity within the biopsy cavity. We inspected carefully for hemostasis. The wounds were thoroughly irrigated.  Clips were used to mark the margins of each biopsy cavity. The wounds were closed with a deep layer of 3-0 Vicryl and a subcuticular layer of 4-0 Monocryl. Benzoin Steri-Strips were applied. The patient was then extubated and brought to the recovery room in stable condition. All sponge, instrument, and needle counts are correct.  Imogene Burn. Georgette Dover, MD, [facility]  General/ Trauma Surgery  04/17/2017 10:30 AM

## 2017-04-17 NOTE — Transfer of Care (Signed)
Immediate Anesthesia Transfer of Care Note  Patient: Stacey Harvey  Procedure(s) Performed: Procedure(s): BILATERAL BREAST LUMPECTOMY WITH RADIOACTIVE SEED LOCALIZATION (Bilateral)  Patient Location: PACU  Anesthesia Type:General  Level of Consciousness: sedated  Airway & Oxygen Therapy: Patient Spontanous Breathing and Patient connected to face mask oxygen  Post-op Assessment: Report given to RN and Post -op Vital signs reviewed and stable  Post vital signs: Reviewed and stable  Last Vitals:  Vitals:   04/17/17 0743  BP: 134/72  Pulse: 66  Resp: 17  Temp: 36.8 C  SpO2: 97%    Last Pain:  Vitals:   04/17/17 0743  TempSrc: Oral         Complications: No apparent anesthesia complications

## 2017-04-18 ENCOUNTER — Encounter (HOSPITAL_BASED_OUTPATIENT_CLINIC_OR_DEPARTMENT_OTHER): Payer: Self-pay | Admitting: Surgery

## 2017-04-25 NOTE — Addendum Note (Signed)
Addendum  created 04/25/17 1610 by Sharee Holster, MD   Anesthesia Attestations filed

## 2019-02-27 IMAGING — MG NEEDLE LOCALIZATION OF THE LEFT BREAST WITH MAMMO GUIDANCE
7 series · 7 of 7 positions shown · non-contrast
Comparison: Previous exam(s).

CLINICAL DATA: 71-year-old female for radioactive seed localization
of left breast complex sclerosing lesion prior to excision.

EXAM:
MAMMOGRAPHIC GUIDED RADIOACTIVE SEED LOCALIZATION OF THE LEFT BREAST

[L CC (1 of 3)]
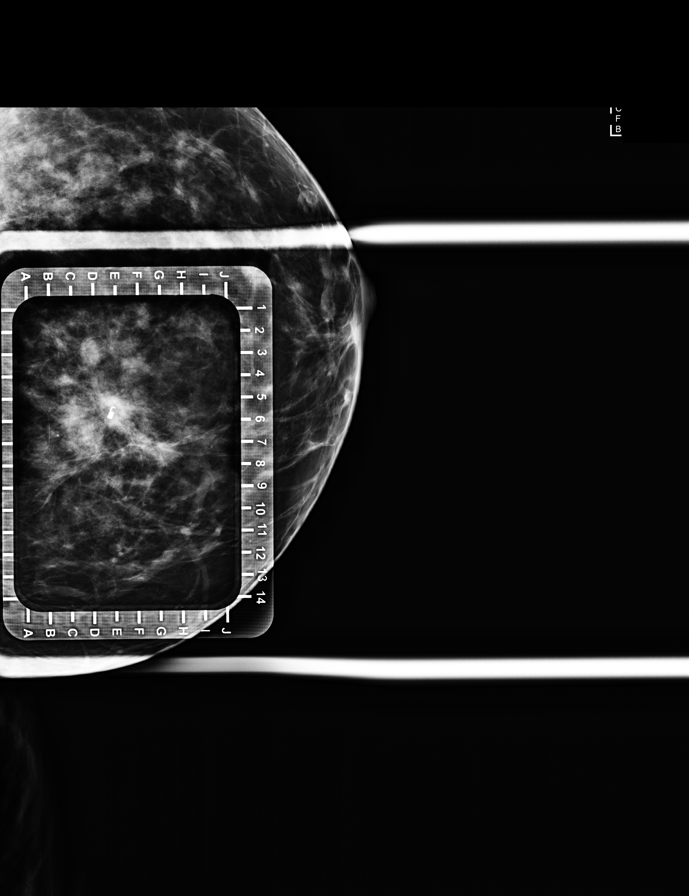

[L CC (2 of 3)]
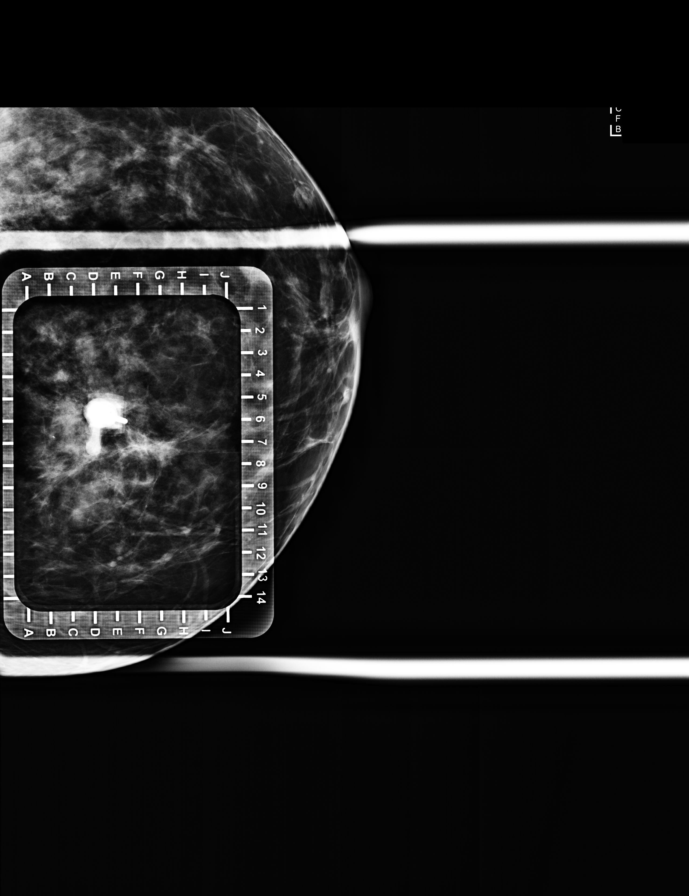

[L ML (1 of 4)]
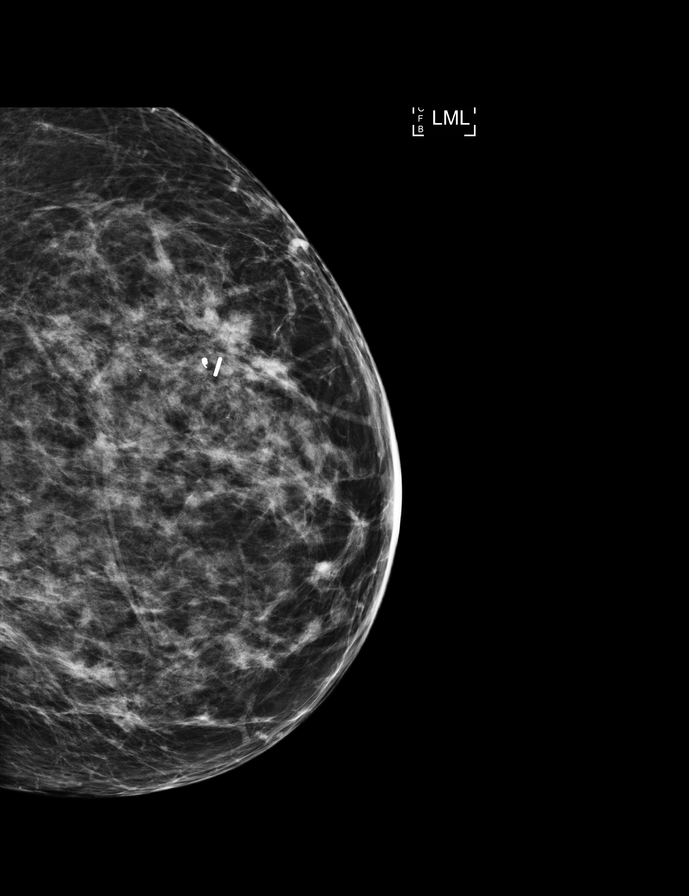

[L ML (2 of 4)]
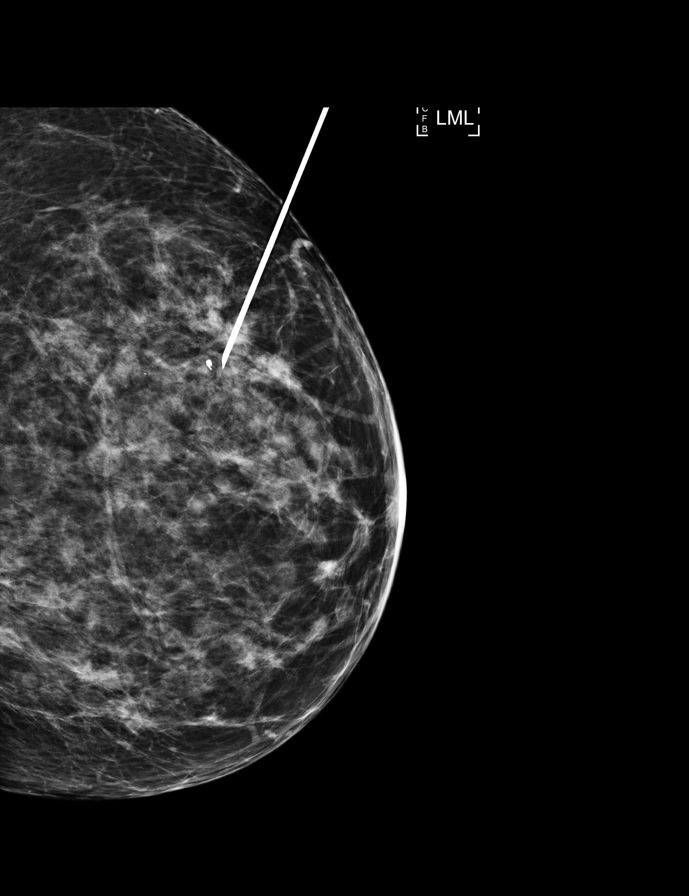

[L ML (3 of 4)]
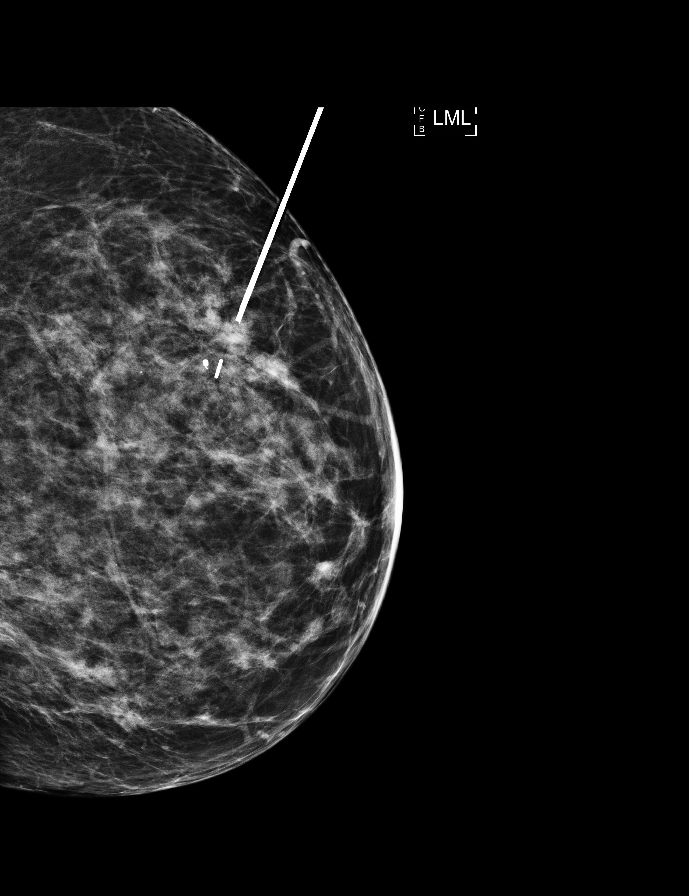

[L ML (4 of 4)]
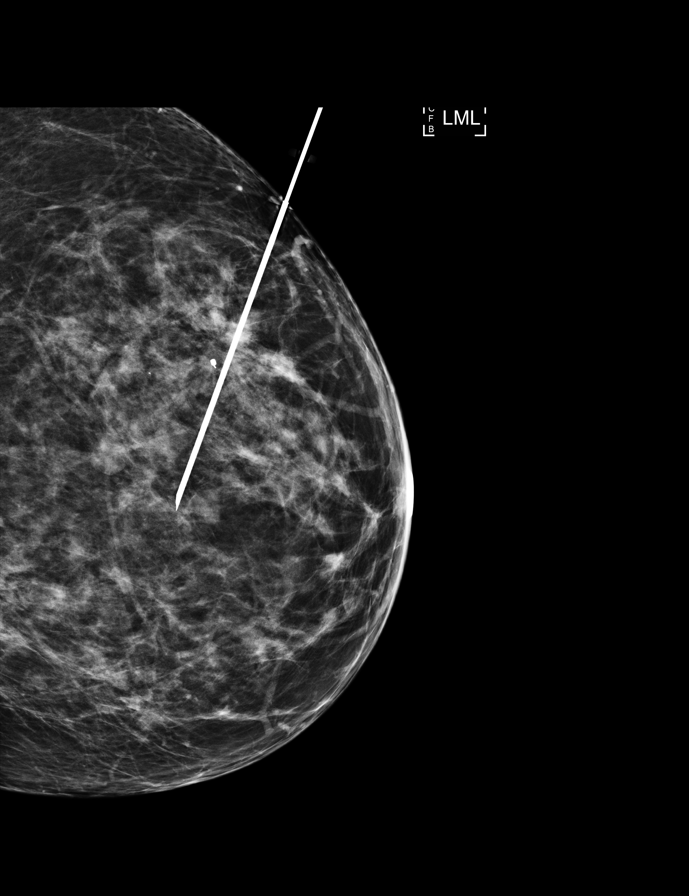

[L CC (3 of 3)]
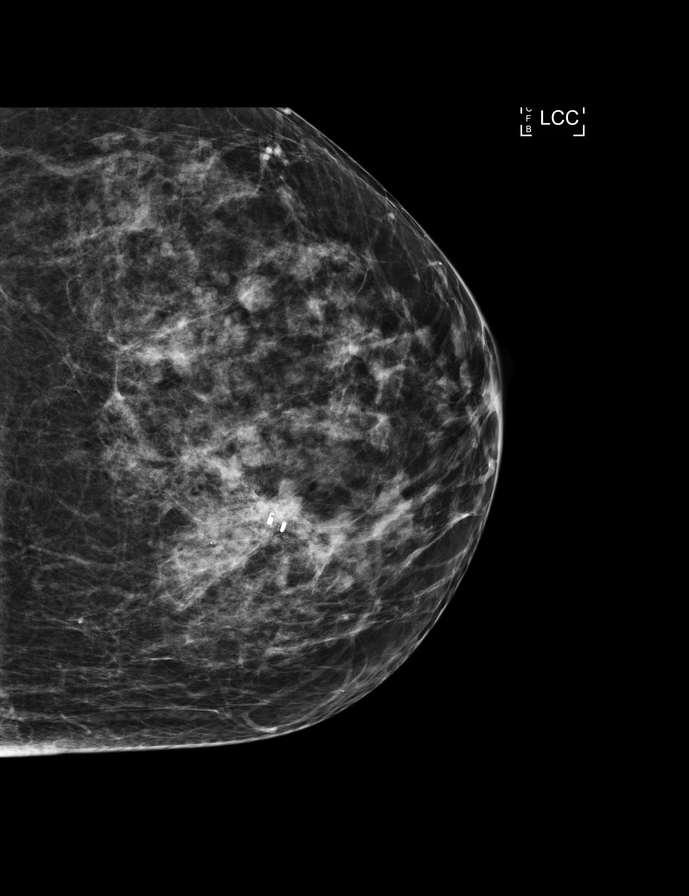

[7 of 7 positions shown; findings below may reference images not displayed]

FINDINGS: Patient presents for radioactive seed localization prior to left
breast excision. I met with the patient and we discussed the
procedure of seed localization including benefits and alternatives.
We discussed the high likelihood of a successful procedure. We
discussed the risks of the procedure including infection, bleeding,
tissue injury and further surgery. We discussed the low dose of
radioactivity involved in the procedure. Informed, written consent
was given.

The usual time-out protocol was performed immediately prior to the
procedure.

Using mammographic guidance, sterile technique, 1% lidocaine and an
D-4LC radioactive seed, the biopsy clip was localized using a
superior approach. On the lateral view today, the distortion lies
immediately above the biopsy clip, so the seed was placed at the
clip.

The follow-up mammogram images confirm the seed in the expected
location and were marked for Dr. Rhanking.

Follow-up survey of the patient confirms presence of the radioactive
seed.

Order number of [id number] seed:  [id number].

Total activity:  0.25 millicuries  Reference Date: 04/07/2017

The patient tolerated the procedure well and was released from the
[REDACTED]. She was given instructions regarding seed removal.
IMPRESSION: Radioactive seed localization left breast. No apparent
complications.
